# Patient Record
Sex: Male | Born: 1995 | Race: White | Hispanic: No | Marital: Single | Smoking: Never smoker
Health system: Southern US, Community
[De-identification: ages and names within clinical notes are randomized; demographics above are authoritative.]

## PROBLEM LIST (undated history)

## (undated) DIAGNOSIS — F909 Attention-deficit hyperactivity disorder, unspecified type: Secondary | ICD-10-CM

## (undated) HISTORY — DX: Attention-deficit hyperactivity disorder, unspecified type: F90.9

---
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
## 2016-04-10 DIAGNOSIS — M9905 Segmental and somatic dysfunction of pelvic region: Secondary | ICD-10-CM | POA: Diagnosis not present

## 2016-04-10 DIAGNOSIS — M9901 Segmental and somatic dysfunction of cervical region: Secondary | ICD-10-CM | POA: Diagnosis not present

## 2016-04-10 DIAGNOSIS — M9902 Segmental and somatic dysfunction of thoracic region: Secondary | ICD-10-CM | POA: Diagnosis not present

## 2016-04-10 DIAGNOSIS — M9903 Segmental and somatic dysfunction of lumbar region: Secondary | ICD-10-CM | POA: Diagnosis not present

## 2016-05-01 DIAGNOSIS — M9903 Segmental and somatic dysfunction of lumbar region: Secondary | ICD-10-CM | POA: Diagnosis not present

## 2016-05-01 DIAGNOSIS — M9901 Segmental and somatic dysfunction of cervical region: Secondary | ICD-10-CM | POA: Diagnosis not present

## 2016-05-01 DIAGNOSIS — M9902 Segmental and somatic dysfunction of thoracic region: Secondary | ICD-10-CM | POA: Diagnosis not present

## 2016-05-01 DIAGNOSIS — M9905 Segmental and somatic dysfunction of pelvic region: Secondary | ICD-10-CM | POA: Diagnosis not present

## 2016-05-14 DIAGNOSIS — D235 Other benign neoplasm of skin of trunk: Secondary | ICD-10-CM | POA: Diagnosis not present

## 2016-05-14 DIAGNOSIS — L559 Sunburn, unspecified: Secondary | ICD-10-CM | POA: Diagnosis not present

## 2016-05-14 DIAGNOSIS — D2272 Melanocytic nevi of left lower limb, including hip: Secondary | ICD-10-CM | POA: Diagnosis not present

## 2016-05-26 DIAGNOSIS — M9901 Segmental and somatic dysfunction of cervical region: Secondary | ICD-10-CM | POA: Diagnosis not present

## 2016-05-26 DIAGNOSIS — M9902 Segmental and somatic dysfunction of thoracic region: Secondary | ICD-10-CM | POA: Diagnosis not present

## 2016-05-26 DIAGNOSIS — M9905 Segmental and somatic dysfunction of pelvic region: Secondary | ICD-10-CM | POA: Diagnosis not present

## 2016-05-26 DIAGNOSIS — M9903 Segmental and somatic dysfunction of lumbar region: Secondary | ICD-10-CM | POA: Diagnosis not present

## 2016-06-02 DIAGNOSIS — H52221 Regular astigmatism, right eye: Secondary | ICD-10-CM | POA: Diagnosis not present

## 2016-06-02 DIAGNOSIS — H5203 Hypermetropia, bilateral: Secondary | ICD-10-CM | POA: Diagnosis not present

## 2016-06-17 DIAGNOSIS — F909 Attention-deficit hyperactivity disorder, unspecified type: Secondary | ICD-10-CM | POA: Diagnosis not present

## 2016-06-25 DIAGNOSIS — M9905 Segmental and somatic dysfunction of pelvic region: Secondary | ICD-10-CM | POA: Diagnosis not present

## 2016-06-25 DIAGNOSIS — M9903 Segmental and somatic dysfunction of lumbar region: Secondary | ICD-10-CM | POA: Diagnosis not present

## 2016-06-25 DIAGNOSIS — M9902 Segmental and somatic dysfunction of thoracic region: Secondary | ICD-10-CM | POA: Diagnosis not present

## 2016-06-25 DIAGNOSIS — M9901 Segmental and somatic dysfunction of cervical region: Secondary | ICD-10-CM | POA: Diagnosis not present

## 2016-10-26 DIAGNOSIS — M9902 Segmental and somatic dysfunction of thoracic region: Secondary | ICD-10-CM | POA: Diagnosis not present

## 2016-10-26 DIAGNOSIS — M9901 Segmental and somatic dysfunction of cervical region: Secondary | ICD-10-CM | POA: Diagnosis not present

## 2016-10-26 DIAGNOSIS — M9905 Segmental and somatic dysfunction of pelvic region: Secondary | ICD-10-CM | POA: Diagnosis not present

## 2016-10-26 DIAGNOSIS — M9903 Segmental and somatic dysfunction of lumbar region: Secondary | ICD-10-CM | POA: Diagnosis not present

## 2016-11-13 DIAGNOSIS — M9902 Segmental and somatic dysfunction of thoracic region: Secondary | ICD-10-CM | POA: Diagnosis not present

## 2016-11-13 DIAGNOSIS — M9903 Segmental and somatic dysfunction of lumbar region: Secondary | ICD-10-CM | POA: Diagnosis not present

## 2016-11-13 DIAGNOSIS — M9905 Segmental and somatic dysfunction of pelvic region: Secondary | ICD-10-CM | POA: Diagnosis not present

## 2016-11-13 DIAGNOSIS — M9901 Segmental and somatic dysfunction of cervical region: Secondary | ICD-10-CM | POA: Diagnosis not present

## 2017-01-12 DIAGNOSIS — M9905 Segmental and somatic dysfunction of pelvic region: Secondary | ICD-10-CM | POA: Diagnosis not present

## 2017-01-12 DIAGNOSIS — M9903 Segmental and somatic dysfunction of lumbar region: Secondary | ICD-10-CM | POA: Diagnosis not present

## 2017-01-12 DIAGNOSIS — M9901 Segmental and somatic dysfunction of cervical region: Secondary | ICD-10-CM | POA: Diagnosis not present

## 2017-01-12 DIAGNOSIS — M9902 Segmental and somatic dysfunction of thoracic region: Secondary | ICD-10-CM | POA: Diagnosis not present

## 2017-05-24 DIAGNOSIS — M9901 Segmental and somatic dysfunction of cervical region: Secondary | ICD-10-CM | POA: Diagnosis not present

## 2017-05-24 DIAGNOSIS — M9902 Segmental and somatic dysfunction of thoracic region: Secondary | ICD-10-CM | POA: Diagnosis not present

## 2017-05-24 DIAGNOSIS — M9905 Segmental and somatic dysfunction of pelvic region: Secondary | ICD-10-CM | POA: Diagnosis not present

## 2017-05-24 DIAGNOSIS — M9903 Segmental and somatic dysfunction of lumbar region: Secondary | ICD-10-CM | POA: Diagnosis not present

## 2017-06-22 DIAGNOSIS — M9901 Segmental and somatic dysfunction of cervical region: Secondary | ICD-10-CM | POA: Diagnosis not present

## 2017-06-22 DIAGNOSIS — M9905 Segmental and somatic dysfunction of pelvic region: Secondary | ICD-10-CM | POA: Diagnosis not present

## 2017-06-22 DIAGNOSIS — M9903 Segmental and somatic dysfunction of lumbar region: Secondary | ICD-10-CM | POA: Diagnosis not present

## 2017-06-22 DIAGNOSIS — M9902 Segmental and somatic dysfunction of thoracic region: Secondary | ICD-10-CM | POA: Diagnosis not present

## 2017-10-01 DIAGNOSIS — K2900 Acute gastritis without bleeding: Secondary | ICD-10-CM | POA: Diagnosis not present

## 2017-10-28 DIAGNOSIS — R109 Unspecified abdominal pain: Secondary | ICD-10-CM | POA: Diagnosis not present

## 2017-10-28 DIAGNOSIS — R634 Abnormal weight loss: Secondary | ICD-10-CM | POA: Diagnosis not present

## 2017-10-28 DIAGNOSIS — R630 Anorexia: Secondary | ICD-10-CM | POA: Diagnosis not present

## 2017-11-15 DIAGNOSIS — M9905 Segmental and somatic dysfunction of pelvic region: Secondary | ICD-10-CM | POA: Diagnosis not present

## 2017-11-15 DIAGNOSIS — M9902 Segmental and somatic dysfunction of thoracic region: Secondary | ICD-10-CM | POA: Diagnosis not present

## 2017-11-15 DIAGNOSIS — M9901 Segmental and somatic dysfunction of cervical region: Secondary | ICD-10-CM | POA: Diagnosis not present

## 2017-11-15 DIAGNOSIS — M9903 Segmental and somatic dysfunction of lumbar region: Secondary | ICD-10-CM | POA: Diagnosis not present

## 2017-12-24 DIAGNOSIS — F418 Other specified anxiety disorders: Secondary | ICD-10-CM | POA: Diagnosis not present

## 2018-01-07 DIAGNOSIS — F909 Attention-deficit hyperactivity disorder, unspecified type: Secondary | ICD-10-CM | POA: Diagnosis not present

## 2018-01-07 DIAGNOSIS — F418 Other specified anxiety disorders: Secondary | ICD-10-CM | POA: Diagnosis not present

## 2018-01-14 DIAGNOSIS — M9902 Segmental and somatic dysfunction of thoracic region: Secondary | ICD-10-CM | POA: Diagnosis not present

## 2018-01-14 DIAGNOSIS — M9905 Segmental and somatic dysfunction of pelvic region: Secondary | ICD-10-CM | POA: Diagnosis not present

## 2018-01-14 DIAGNOSIS — M9901 Segmental and somatic dysfunction of cervical region: Secondary | ICD-10-CM | POA: Diagnosis not present

## 2018-01-14 DIAGNOSIS — M9903 Segmental and somatic dysfunction of lumbar region: Secondary | ICD-10-CM | POA: Diagnosis not present

## 2018-02-04 DIAGNOSIS — F418 Other specified anxiety disorders: Secondary | ICD-10-CM | POA: Diagnosis not present

## 2018-03-24 DIAGNOSIS — M9903 Segmental and somatic dysfunction of lumbar region: Secondary | ICD-10-CM | POA: Diagnosis not present

## 2018-03-24 DIAGNOSIS — M9901 Segmental and somatic dysfunction of cervical region: Secondary | ICD-10-CM | POA: Diagnosis not present

## 2018-03-24 DIAGNOSIS — M9905 Segmental and somatic dysfunction of pelvic region: Secondary | ICD-10-CM | POA: Diagnosis not present

## 2018-03-24 DIAGNOSIS — M9902 Segmental and somatic dysfunction of thoracic region: Secondary | ICD-10-CM | POA: Diagnosis not present

## 2018-04-20 DIAGNOSIS — F418 Other specified anxiety disorders: Secondary | ICD-10-CM | POA: Diagnosis not present

## 2018-04-20 DIAGNOSIS — F909 Attention-deficit hyperactivity disorder, unspecified type: Secondary | ICD-10-CM | POA: Diagnosis not present

## 2018-05-05 DIAGNOSIS — M9903 Segmental and somatic dysfunction of lumbar region: Secondary | ICD-10-CM | POA: Diagnosis not present

## 2018-05-05 DIAGNOSIS — M9905 Segmental and somatic dysfunction of pelvic region: Secondary | ICD-10-CM | POA: Diagnosis not present

## 2018-05-05 DIAGNOSIS — M9902 Segmental and somatic dysfunction of thoracic region: Secondary | ICD-10-CM | POA: Diagnosis not present

## 2018-05-05 DIAGNOSIS — M9901 Segmental and somatic dysfunction of cervical region: Secondary | ICD-10-CM | POA: Diagnosis not present

## 2018-06-22 DIAGNOSIS — M9901 Segmental and somatic dysfunction of cervical region: Secondary | ICD-10-CM | POA: Diagnosis not present

## 2018-06-22 DIAGNOSIS — M9903 Segmental and somatic dysfunction of lumbar region: Secondary | ICD-10-CM | POA: Diagnosis not present

## 2018-06-22 DIAGNOSIS — M9902 Segmental and somatic dysfunction of thoracic region: Secondary | ICD-10-CM | POA: Diagnosis not present

## 2018-06-22 DIAGNOSIS — M9905 Segmental and somatic dysfunction of pelvic region: Secondary | ICD-10-CM | POA: Diagnosis not present

## 2018-10-24 DIAGNOSIS — M9903 Segmental and somatic dysfunction of lumbar region: Secondary | ICD-10-CM | POA: Diagnosis not present

## 2018-10-24 DIAGNOSIS — M9902 Segmental and somatic dysfunction of thoracic region: Secondary | ICD-10-CM | POA: Diagnosis not present

## 2018-10-24 DIAGNOSIS — M9905 Segmental and somatic dysfunction of pelvic region: Secondary | ICD-10-CM | POA: Diagnosis not present

## 2018-10-24 DIAGNOSIS — M9901 Segmental and somatic dysfunction of cervical region: Secondary | ICD-10-CM | POA: Diagnosis not present

## 2019-06-18 DIAGNOSIS — Z20828 Contact with and (suspected) exposure to other viral communicable diseases: Secondary | ICD-10-CM | POA: Diagnosis not present

## 2019-06-24 DIAGNOSIS — Z20828 Contact with and (suspected) exposure to other viral communicable diseases: Secondary | ICD-10-CM | POA: Diagnosis not present

## 2019-06-30 DIAGNOSIS — M9903 Segmental and somatic dysfunction of lumbar region: Secondary | ICD-10-CM | POA: Diagnosis not present

## 2019-06-30 DIAGNOSIS — M9902 Segmental and somatic dysfunction of thoracic region: Secondary | ICD-10-CM | POA: Diagnosis not present

## 2019-06-30 DIAGNOSIS — M9905 Segmental and somatic dysfunction of pelvic region: Secondary | ICD-10-CM | POA: Diagnosis not present

## 2019-06-30 DIAGNOSIS — M9901 Segmental and somatic dysfunction of cervical region: Secondary | ICD-10-CM | POA: Diagnosis not present

## 2019-07-31 DIAGNOSIS — M9905 Segmental and somatic dysfunction of pelvic region: Secondary | ICD-10-CM | POA: Diagnosis not present

## 2019-07-31 DIAGNOSIS — M9902 Segmental and somatic dysfunction of thoracic region: Secondary | ICD-10-CM | POA: Diagnosis not present

## 2019-07-31 DIAGNOSIS — M9901 Segmental and somatic dysfunction of cervical region: Secondary | ICD-10-CM | POA: Diagnosis not present

## 2019-07-31 DIAGNOSIS — M9903 Segmental and somatic dysfunction of lumbar region: Secondary | ICD-10-CM | POA: Diagnosis not present

## 2019-08-28 DIAGNOSIS — M9905 Segmental and somatic dysfunction of pelvic region: Secondary | ICD-10-CM | POA: Diagnosis not present

## 2019-08-28 DIAGNOSIS — M9901 Segmental and somatic dysfunction of cervical region: Secondary | ICD-10-CM | POA: Diagnosis not present

## 2019-08-28 DIAGNOSIS — M9902 Segmental and somatic dysfunction of thoracic region: Secondary | ICD-10-CM | POA: Diagnosis not present

## 2019-08-28 DIAGNOSIS — M9903 Segmental and somatic dysfunction of lumbar region: Secondary | ICD-10-CM | POA: Diagnosis not present

## 2019-08-31 DIAGNOSIS — L309 Dermatitis, unspecified: Secondary | ICD-10-CM | POA: Diagnosis not present

## 2019-09-07 DIAGNOSIS — L739 Follicular disorder, unspecified: Secondary | ICD-10-CM | POA: Diagnosis not present

## 2019-10-09 DIAGNOSIS — Z23 Encounter for immunization: Secondary | ICD-10-CM | POA: Diagnosis not present

## 2019-10-09 DIAGNOSIS — L309 Dermatitis, unspecified: Secondary | ICD-10-CM | POA: Diagnosis not present

## 2019-12-02 DIAGNOSIS — Z20828 Contact with and (suspected) exposure to other viral communicable diseases: Secondary | ICD-10-CM | POA: Diagnosis not present

## 2020-02-07 DIAGNOSIS — L729 Follicular cyst of the skin and subcutaneous tissue, unspecified: Secondary | ICD-10-CM | POA: Diagnosis not present

## 2020-03-08 DIAGNOSIS — L810 Postinflammatory hyperpigmentation: Secondary | ICD-10-CM | POA: Diagnosis not present

## 2020-03-08 DIAGNOSIS — L723 Sebaceous cyst: Secondary | ICD-10-CM | POA: Diagnosis not present

## 2020-03-29 DIAGNOSIS — J029 Acute pharyngitis, unspecified: Secondary | ICD-10-CM | POA: Diagnosis not present

## 2020-04-23 DIAGNOSIS — M9903 Segmental and somatic dysfunction of lumbar region: Secondary | ICD-10-CM | POA: Diagnosis not present

## 2020-04-23 DIAGNOSIS — M9901 Segmental and somatic dysfunction of cervical region: Secondary | ICD-10-CM | POA: Diagnosis not present

## 2020-04-23 DIAGNOSIS — M9905 Segmental and somatic dysfunction of pelvic region: Secondary | ICD-10-CM | POA: Diagnosis not present

## 2020-04-23 DIAGNOSIS — M9902 Segmental and somatic dysfunction of thoracic region: Secondary | ICD-10-CM | POA: Diagnosis not present

## 2020-05-07 DIAGNOSIS — Z0000 Encounter for general adult medical examination without abnormal findings: Secondary | ICD-10-CM | POA: Diagnosis not present

## 2020-05-07 DIAGNOSIS — Z23 Encounter for immunization: Secondary | ICD-10-CM | POA: Diagnosis not present

## 2020-05-14 DIAGNOSIS — M9902 Segmental and somatic dysfunction of thoracic region: Secondary | ICD-10-CM | POA: Diagnosis not present

## 2020-05-14 DIAGNOSIS — M9901 Segmental and somatic dysfunction of cervical region: Secondary | ICD-10-CM | POA: Diagnosis not present

## 2020-05-14 DIAGNOSIS — M9905 Segmental and somatic dysfunction of pelvic region: Secondary | ICD-10-CM | POA: Diagnosis not present

## 2020-05-14 DIAGNOSIS — M9903 Segmental and somatic dysfunction of lumbar region: Secondary | ICD-10-CM | POA: Diagnosis not present
